# Patient Record
Sex: Female | Born: 2000 | Race: White | Hispanic: No | Marital: Single | Smoking: Never smoker
Health system: Southern US, Community
[De-identification: ages and names within clinical notes are randomized; demographics above are authoritative.]

## PROBLEM LIST (undated history)

## (undated) DIAGNOSIS — R569 Unspecified convulsions: Secondary | ICD-10-CM

## (undated) HISTORY — PX: TUMOR REMOVAL: SHX12

---
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
## 2021-04-23 IMAGING — CT CT HEAD W/O CM
4 series · 16 of 47 positions shown, 18 images · non-contrast
Comparison: None.

CLINICAL DATA: Seizure at work

EXAM:
CT HEAD WITHOUT CONTRAST
TECHNIQUE: Contiguous axial images were obtained from the base of the skull
through the vertex without intravenous contrast.

[Series 3: head without · axial · non-contrast · 0.45mm/px · z∈[-133,-13]mm · 7 of 33 slices shown, 9 images]
[im 5/33  brain]
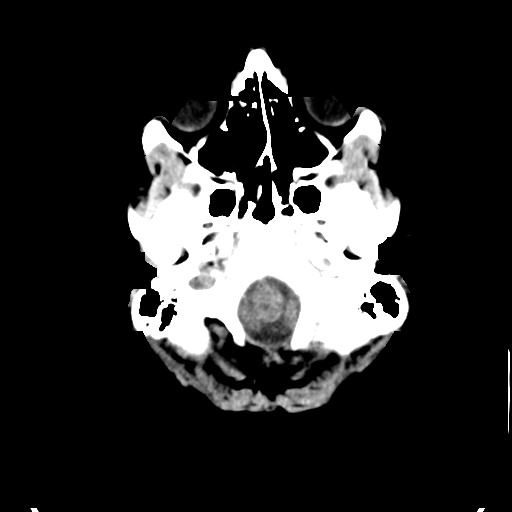
[im 5/33  bone]
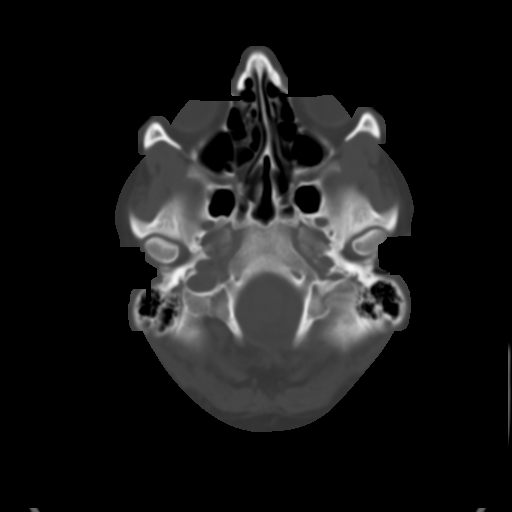
[im 9/33  brain]
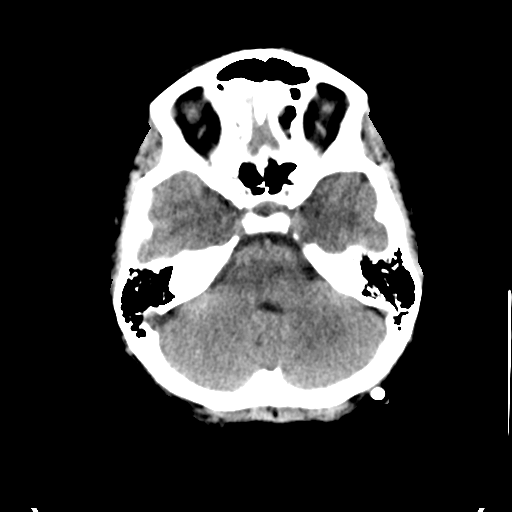
[im 13/33  brain]
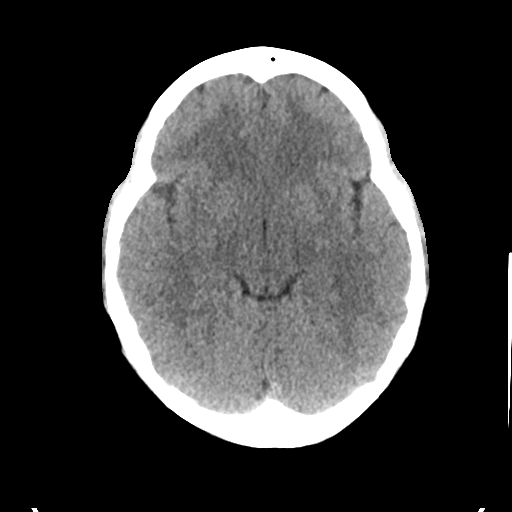
[im 17/33  brain]
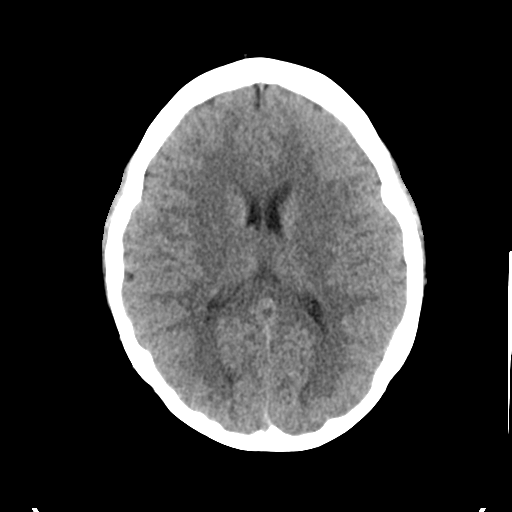
[im 21/33  brain]
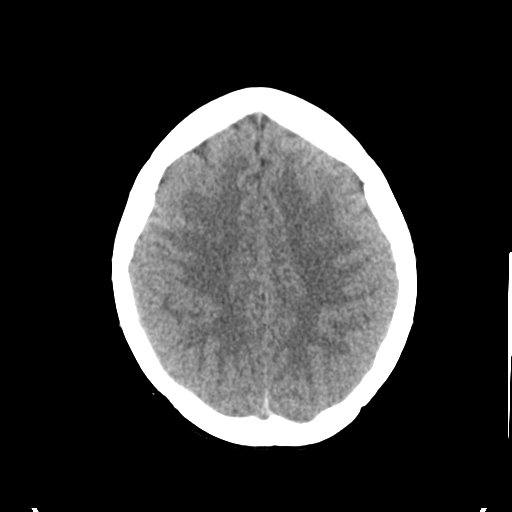
[im 21/33  bone]
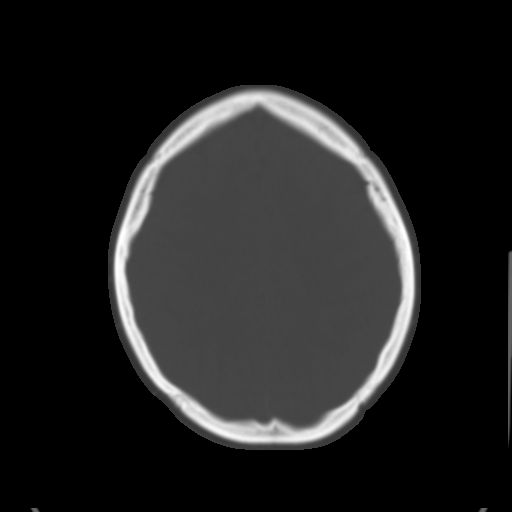
[im 25/33  brain]
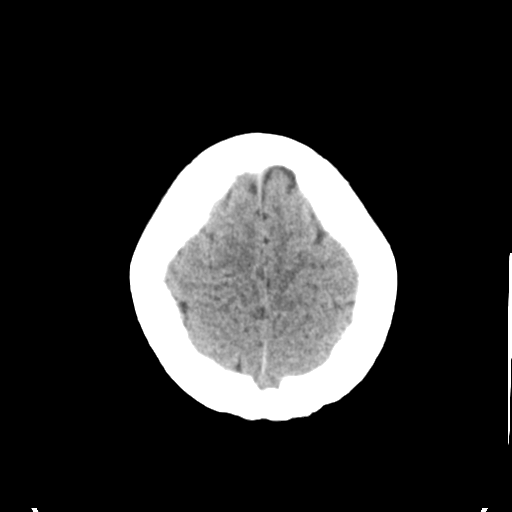
[im 29/33  brain]
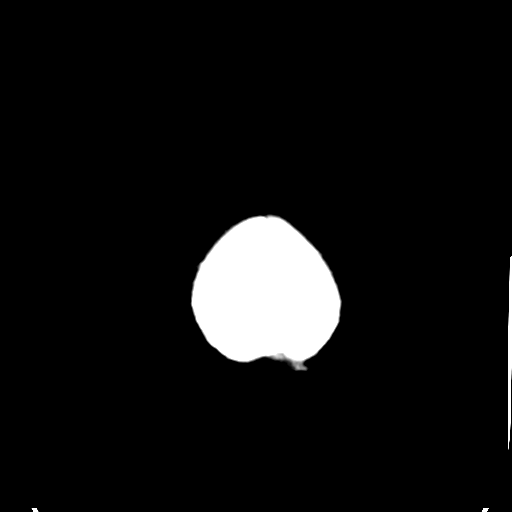

[Series 4: head bone · axial · 0.45mm/px · z∈[-137,-105]mm · 3 of 82 slices shown]
[im 9/82  bone]
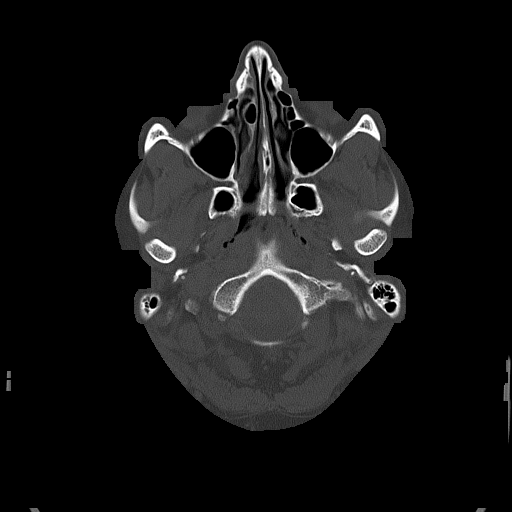
[im 17/82  bone]
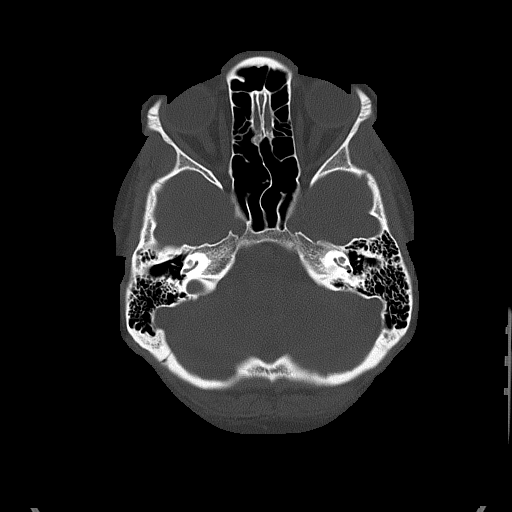
[im 25/82  bone]
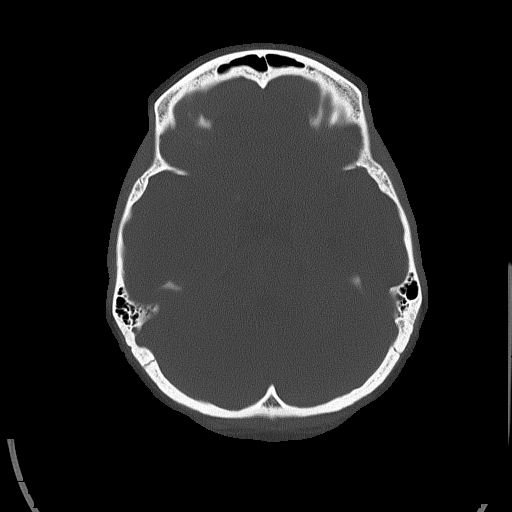

[Series 5: head without cor · coronal · non-contrast · 0.31mm/px · 3 of 67 slices shown]
[im 23/67  brain]
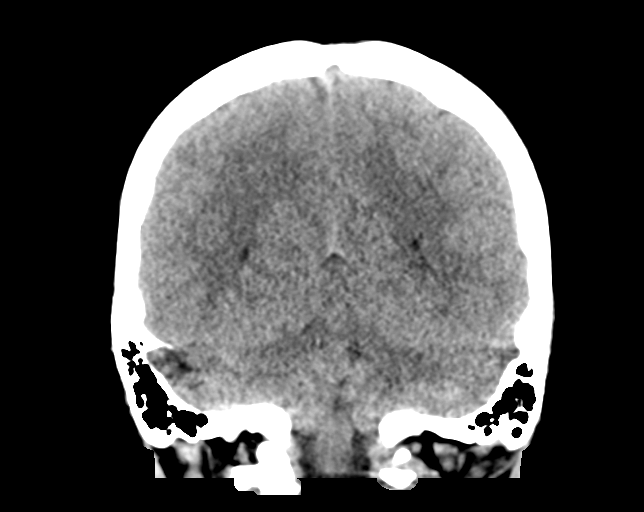
[im 30/67  brain]
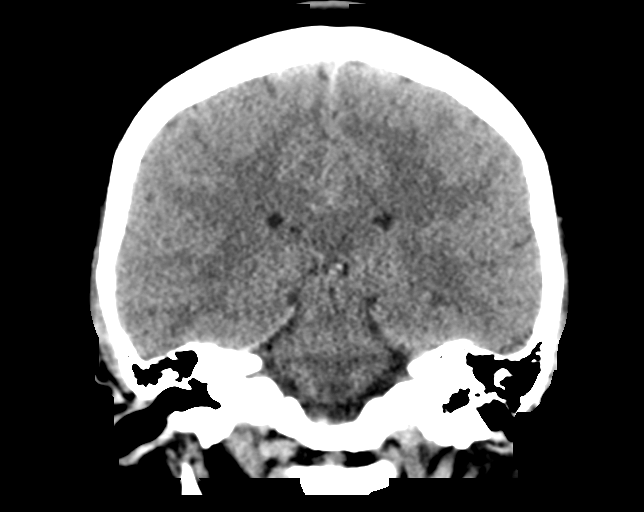
[im 37/67  brain]
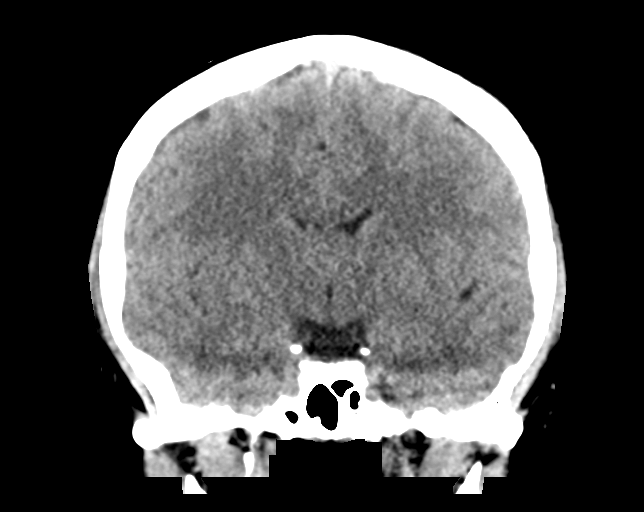

[Series 6: head without sag · sagittal · non-contrast · 0.32mm/px · 3 of 67 slices shown]
[im 23/67  brain]
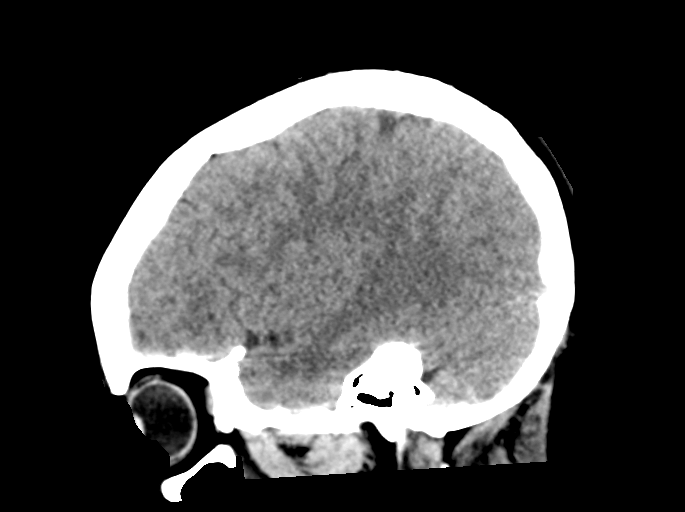
[im 34/67  brain]
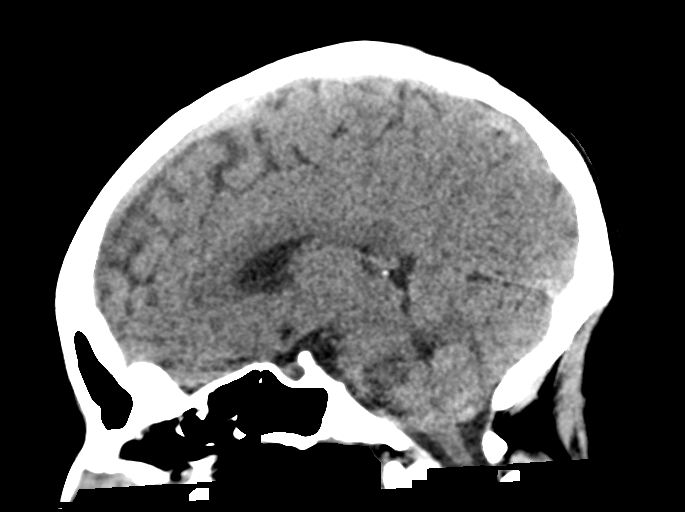
[im 45/67  brain]
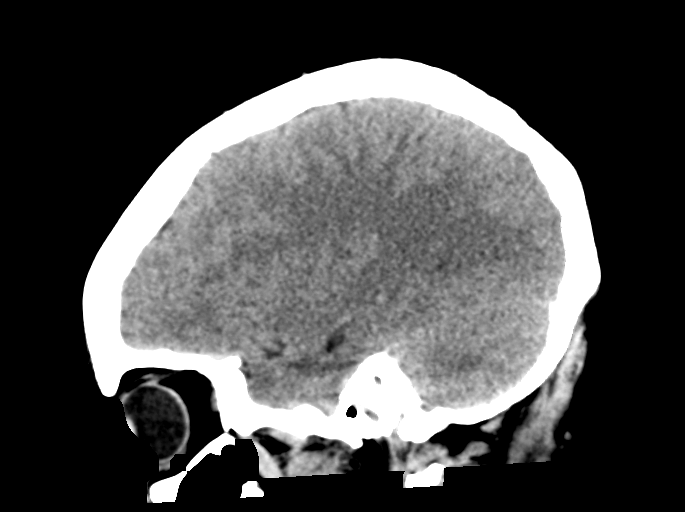

[16 of 47 positions shown; findings below may reference images not displayed]

FINDINGS: Brain: No evidence of acute territorial infarction, hemorrhage,
hydrocephalus,extra-axial collection or mass lesion/mass effect.
Normal gray-white differentiation. Ventricles are normal in size and
contour.

Vascular: No hyperdense vessel or unexpected calcification.

Skull: The skull is intact. No fracture or focal lesion identified.

Sinuses/Orbits: The visualized paranasal sinuses and mastoid air
cells are clear. The orbits and globes intact.

Other: None
IMPRESSION: No acute intracranial abnormality.
